# Patient Record
Sex: Female | Born: 1950 | Race: White | Hispanic: No | Marital: Married | Smoking: Current every day smoker
Health system: Southern US, Community
[De-identification: ages and names within clinical notes are randomized; demographics above are authoritative.]

## PROBLEM LIST (undated history)

## (undated) DIAGNOSIS — E7800 Pure hypercholesterolemia, unspecified: Secondary | ICD-10-CM

## (undated) DIAGNOSIS — E538 Deficiency of other specified B group vitamins: Secondary | ICD-10-CM

## (undated) DIAGNOSIS — K219 Gastro-esophageal reflux disease without esophagitis: Secondary | ICD-10-CM

## (undated) DIAGNOSIS — K2270 Barrett's esophagus without dysplasia: Secondary | ICD-10-CM

## (undated) DIAGNOSIS — J449 Chronic obstructive pulmonary disease, unspecified: Secondary | ICD-10-CM

## (undated) HISTORY — PX: CATARACT EXTRACTION: SUR2

---

## 2013-10-09 ENCOUNTER — Encounter (HOSPITAL_BASED_OUTPATIENT_CLINIC_OR_DEPARTMENT_OTHER): Payer: Self-pay | Admitting: Emergency Medicine

## 2013-10-09 ENCOUNTER — Emergency Department (HOSPITAL_BASED_OUTPATIENT_CLINIC_OR_DEPARTMENT_OTHER): Payer: Medicare Other

## 2013-10-09 ENCOUNTER — Emergency Department (HOSPITAL_BASED_OUTPATIENT_CLINIC_OR_DEPARTMENT_OTHER)
Admission: EM | Admit: 2013-10-09 | Discharge: 2013-10-09 | Disposition: A | Discharge status: Home / Self Care | Payer: Medicare Other | Attending: Emergency Medicine | Admitting: Emergency Medicine

## 2013-10-09 DIAGNOSIS — J449 Chronic obstructive pulmonary disease, unspecified: Secondary | ICD-10-CM | POA: Insufficient documentation

## 2013-10-09 DIAGNOSIS — J4489 Other specified chronic obstructive pulmonary disease: Secondary | ICD-10-CM | POA: Insufficient documentation

## 2013-10-09 DIAGNOSIS — S93409A Sprain of unspecified ligament of unspecified ankle, initial encounter: Secondary | ICD-10-CM | POA: Insufficient documentation

## 2013-10-09 DIAGNOSIS — Z862 Personal history of diseases of the blood and blood-forming organs and certain disorders involving the immune mechanism: Secondary | ICD-10-CM | POA: Insufficient documentation

## 2013-10-09 DIAGNOSIS — Z880 Allergy status to penicillin: Secondary | ICD-10-CM | POA: Insufficient documentation

## 2013-10-09 DIAGNOSIS — F17200 Nicotine dependence, unspecified, uncomplicated: Secondary | ICD-10-CM | POA: Insufficient documentation

## 2013-10-09 DIAGNOSIS — X500XXA Overexertion from strenuous movement or load, initial encounter: Secondary | ICD-10-CM | POA: Insufficient documentation

## 2013-10-09 DIAGNOSIS — W108XXA Fall (on) (from) other stairs and steps, initial encounter: Secondary | ICD-10-CM | POA: Insufficient documentation

## 2013-10-09 DIAGNOSIS — Z8639 Personal history of other endocrine, nutritional and metabolic disease: Secondary | ICD-10-CM | POA: Insufficient documentation

## 2013-10-09 DIAGNOSIS — Y939 Activity, unspecified: Secondary | ICD-10-CM | POA: Insufficient documentation

## 2013-10-09 DIAGNOSIS — S93401A Sprain of unspecified ligament of right ankle, initial encounter: Secondary | ICD-10-CM

## 2013-10-09 DIAGNOSIS — Z8719 Personal history of other diseases of the digestive system: Secondary | ICD-10-CM | POA: Insufficient documentation

## 2013-10-09 DIAGNOSIS — Y929 Unspecified place or not applicable: Secondary | ICD-10-CM | POA: Insufficient documentation

## 2013-10-09 HISTORY — DX: Deficiency of other specified B group vitamins: E53.8

## 2013-10-09 HISTORY — DX: Chronic obstructive pulmonary disease, unspecified: J44.9

## 2013-10-09 HISTORY — DX: Gastro-esophageal reflux disease without esophagitis: K21.9

## 2013-10-09 HISTORY — DX: Pure hypercholesterolemia, unspecified: E78.00

## 2013-10-09 HISTORY — DX: Barrett's esophagus without dysplasia: K22.70

## 2013-10-09 MED ORDER — IBUPROFEN 400 MG PO TABS
400.0000 mg | ORAL_TABLET | Freq: Once | ORAL | Status: AC
  Administered 2013-10-09: 400 mg via ORAL
  Filled 2013-10-09: qty 1

## 2013-10-09 MED ORDER — OXYCODONE-ACETAMINOPHEN 5-325 MG PO TABS
1.0000 | ORAL_TABLET | Freq: Once | ORAL | Status: AC
  Administered 2013-10-09: 1 via ORAL
  Filled 2013-10-09: qty 1

## 2013-10-09 MED ORDER — OXYCODONE-ACETAMINOPHEN 5-325 MG PO TABS: 1.0000 | ORAL_TABLET | ORAL | Status: AC | PRN

## 2013-10-09 NOTE — ED Provider Notes (Signed)
CSN: [id number]     Arrival date & time 10/09/13  1649 History  This chart was scribed for Junius ArgyleForrest S Krystyl Cannell, MD by Elveria Risingimelie Horne, ED scribe.  This patient was seen in room MH07/MH07 and the patient's care was started at 4:55 PM.   Chief Complaint  Patient presents with  . Ankle Pain    Patient is a 63 y.o. female presenting with ankle pain. The history is provided by the patient. No language interpreter was used.  Ankle Pain Location:  Ankle and foot Time since incident:  30 minutes Injury: yes   Mechanism of injury: fall   Fall:    Fall occurred:  Down stairs   Height of fall:  1 step   Impact surface:  [employer]   Point of impact:  Hands Ankle location:  R ankle Foot location:  Dorsum of R foot Pain details:    Quality:  Sharp   Radiates to:  Does not radiate   Severity:  Severe   Onset quality:  Sudden Prior injury to area:  No Relieved by:  Nothing Ineffective treatments:  None tried Associated symptoms: no back pain, no fatigue, no fever and no neck pain    HPI Comments: Sherry Simmons is a 63 y.o. female who presents to the Emergency Department complaining of right ankle injury which occurred about 30 minutes ago. Patient fell down stairs, face forward and twisted her right foot. She fell when she reached the last step. Patient fell on carpeted surface. Patient landed on her hands. She did not hit her head or lose consciousness. Patient rates pain 12/10. Patient did not attempt any treatment prior to arrival.   Past Medical History  Diagnosis Date  . COPD (chronic obstructive pulmonary disease)   . Barrett esophagus   . High cholesterol   . GERD (gastroesophageal reflux disease)   . B12 deficiency    Past Surgical History  Procedure Laterality Date  . Cataract extraction     No family history on file. History  Substance Use Topics  . Smoking status: Current Every Day Smoker  . Smokeless tobacco: Not on file  . Alcohol Use: No   OB History   Grav Para Term  Preterm Abortions TAB SAB Ect Mult Living                 Review of Systems  Constitutional: Negative for fever, chills, diaphoresis, activity change, appetite change and fatigue.  HENT: Negative for congestion, facial swelling, rhinorrhea and sore throat.   Eyes: Negative for photophobia and discharge.  Respiratory: Negative for cough, chest tightness and shortness of breath.   Cardiovascular: Negative for chest pain, palpitations and leg swelling.  Gastrointestinal: Negative for nausea, vomiting, abdominal pain and diarrhea.  Endocrine: Negative for polydipsia and polyuria.  Genitourinary: Negative for dysuria, frequency, difficulty urinating and pelvic pain.  Musculoskeletal: Negative for arthralgias, back pain, neck pain and neck stiffness.  Skin: Negative for color change and wound.  Allergic/Immunologic: Negative for immunocompromised state.  Neurological: Negative for facial asymmetry, weakness, numbness and headaches.  Hematological: Does not bruise/bleed easily.  Psychiatric/Behavioral: Negative for confusion and agitation.    Allergies  Penicillins  Home Medications  No current outpatient prescriptions on file.  Triage Vitals: BP 106/68  Pulse 85  Temp(Src) 99.2 F (37.3 C) (Oral)  Resp 18  Ht 5\' 9"  (1.753 m)  Wt 125 lb (56.7 kg)  BMI 18.45 kg/m2  SpO2 96% Physical Exam  Nursing note and vitals reviewed. Constitutional: She is  oriented to person, place, and time. She appears well-developed and well-nourished. No distress.  HENT:  Head: Normocephalic and atraumatic.  Mouth/Throat: Oropharynx is clear and moist.  Eyes: Pupils are equal, round, and reactive to light.  Neck: Normal range of motion. Neck supple.  No vertebral tenderness.   Cardiovascular: Normal rate, regular rhythm and normal heart sounds.   Pulmonary/Chest: Effort normal and breath sounds normal. No respiratory distress. She has no wheezes.  Abdominal: Soft. She exhibits no distension. There is  no tenderness. There is no rebound and no guarding.  Musculoskeletal: She exhibits no edema and no tenderness.  Mild swelling and ecchymosis to right lateral malleolus. Mild to moderate tenderness to palpation in this area.   2+ DP and PT pulses in bilateral lower extremities.    Normal capillary refill in right lower extremity.  Motor skills grossly intact in the right lower extremity.  Neurological: She is alert and oriented to person, place, and time.  Skin: Skin is warm and dry.  Psychiatric: She has a normal mood and affect. Her behavior is normal.    ED Course  Procedures (including critical care time) DIAGNOSTIC STUDIES: Oxygen Saturation is 96% on room air, normal by my interpretation.    COORDINATION OF CARE: 5:01 PM- Will order Percocet. Pt advised of plan for treatment and pt agrees.   Labs Review Labs Reviewed - No data to display Imaging Review Dg Ankle Complete Right  10/09/2013   CLINICAL DATA:  Trauma and pain.  EXAM: RIGHT ANKLE - COMPLETE 3+ VIEW  COMPARISON:  None.  FINDINGS: No definite acute fracture or dislocation. Mild tibiotalar osteoarthritis. Osseous irregularity about the navicular is favored to be degenerative.  IMPRESSION: Probable degenerative irregularity about the navicula anteriorly. Correlate with point tenderness.  Otherwise, no acute osseous abnormality.   Electronically Signed   By: Jeronimo Greaves M.D.   On: 10/09/2013 17:40   Dg Foot Complete Right  10/09/2013   CLINICAL DATA:  Fall, right ankle and foot pain.  EXAM: RIGHT FOOT COMPLETE - 3+ VIEW  COMPARISON:  DG ANKLE COMPLETE*R* dated 10/09/2013  FINDINGS: Soft tissue swelling over the right fifth metatarsal. Irregularity noted along the anterior aspect of the navicular felt to most likely be degenerative. Recommend correlation for pain in this area. No acute fracture, subluxation or dislocation visualized.  IMPRESSION: No definite acute bony abnormality. Irregularity along the anterior aspect of the  navicular likely degenerative.   Electronically Signed   By: Charlett Nose M.D.   On: 10/09/2013 17:40    EKG Interpretation   None       MDM   1. Right ankle sprain    5:24 PM 63 y.o. female who presents with mechanical fall down one stair. She braced her fall denies hitting her head or loss of consciousness. Vital signs are unremarkable here. Mild deformity to right ankle. Will get pain control with Percocet and imaging.   6:14 PM: Irregularity noted at ant aspect of navicular on plain film, likely degenerative. I re-examined the pt, no real focal ttp in this area and pt's injury is localized to the right lateral ankle. Will ace wrap, place in post op shoe and provide crutches.  I have discussed the diagnosis/risks/treatment options with the patient and believe the pt to be eligible for discharge home to follow-up with pcp in 1 week if pain persists. We also discussed returning to the ED immediately if new or worsening sx occur. We discussed the sx which are most concerning (e.g.,  worsening pain) that necessitate immediate return. Medications administered to the patient during their visit and any new prescriptions provided to the patient are listed below.  Medications given during this visit Medications  ibuprofen (ADVIL,MOTRIN) tablet 400 mg (not administered)  oxyCODONE-acetaminophen (PERCOCET/ROXICET) 5-325 MG per tablet 1 tablet (1 tablet Oral Given 10/09/13 1705)    New Prescriptions   OXYCODONE-ACETAMINOPHEN (PERCOCET) 5-325 MG PER TABLET    Take 1 tablet by mouth every 4 (four) hours as needed.      I personally performed the services described in this documentation, which was scribed in my presence. The recorded information has been reviewed and is accurate.     Junius Argyle, MD 10/09/13 1816

## 2013-10-09 NOTE — Discharge Instructions (Signed)

## 2013-10-09 NOTE — ED Notes (Signed)
Fell down stairs, appx 1-2 steps, twisting right ankle.

## 2014-07-18 IMAGING — CR DG ANKLE COMPLETE 3+V*R*
3 series · 3 of 3 positions shown · non-contrast
Comparison: None.

CLINICAL DATA: Trauma and pain.

EXAM:
RIGHT ANKLE - COMPLETE 3+ VIEW

[t ankle joint ap right]
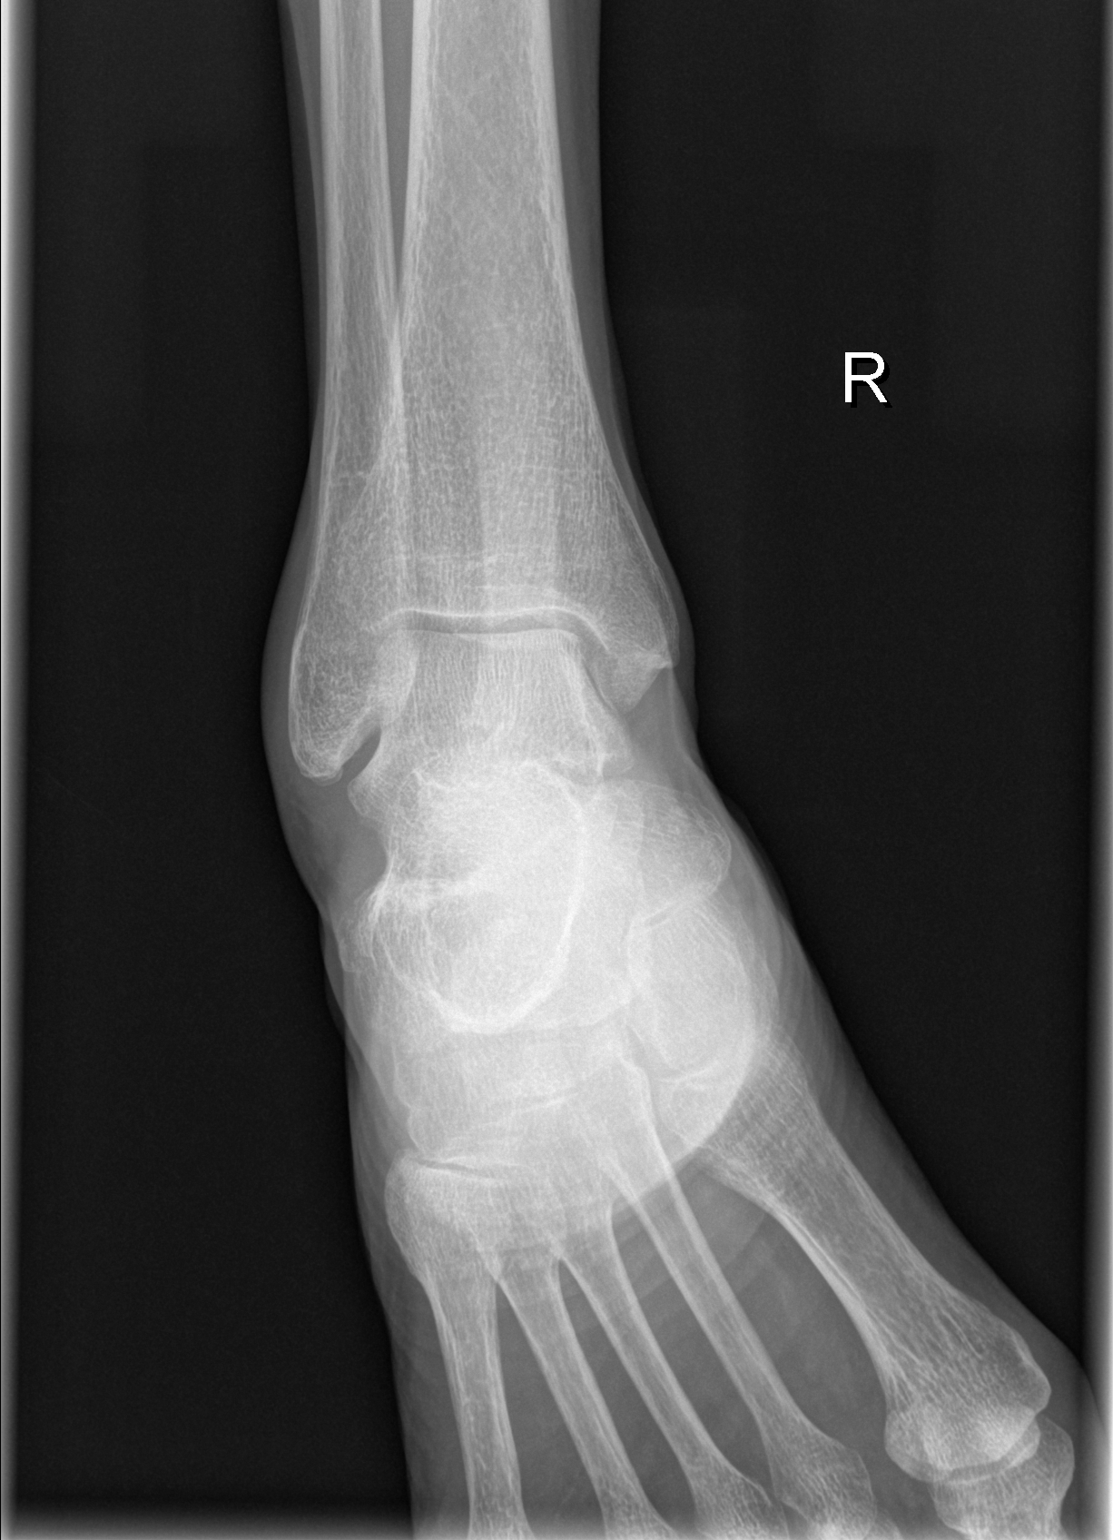

[t ankle joint oblique right]
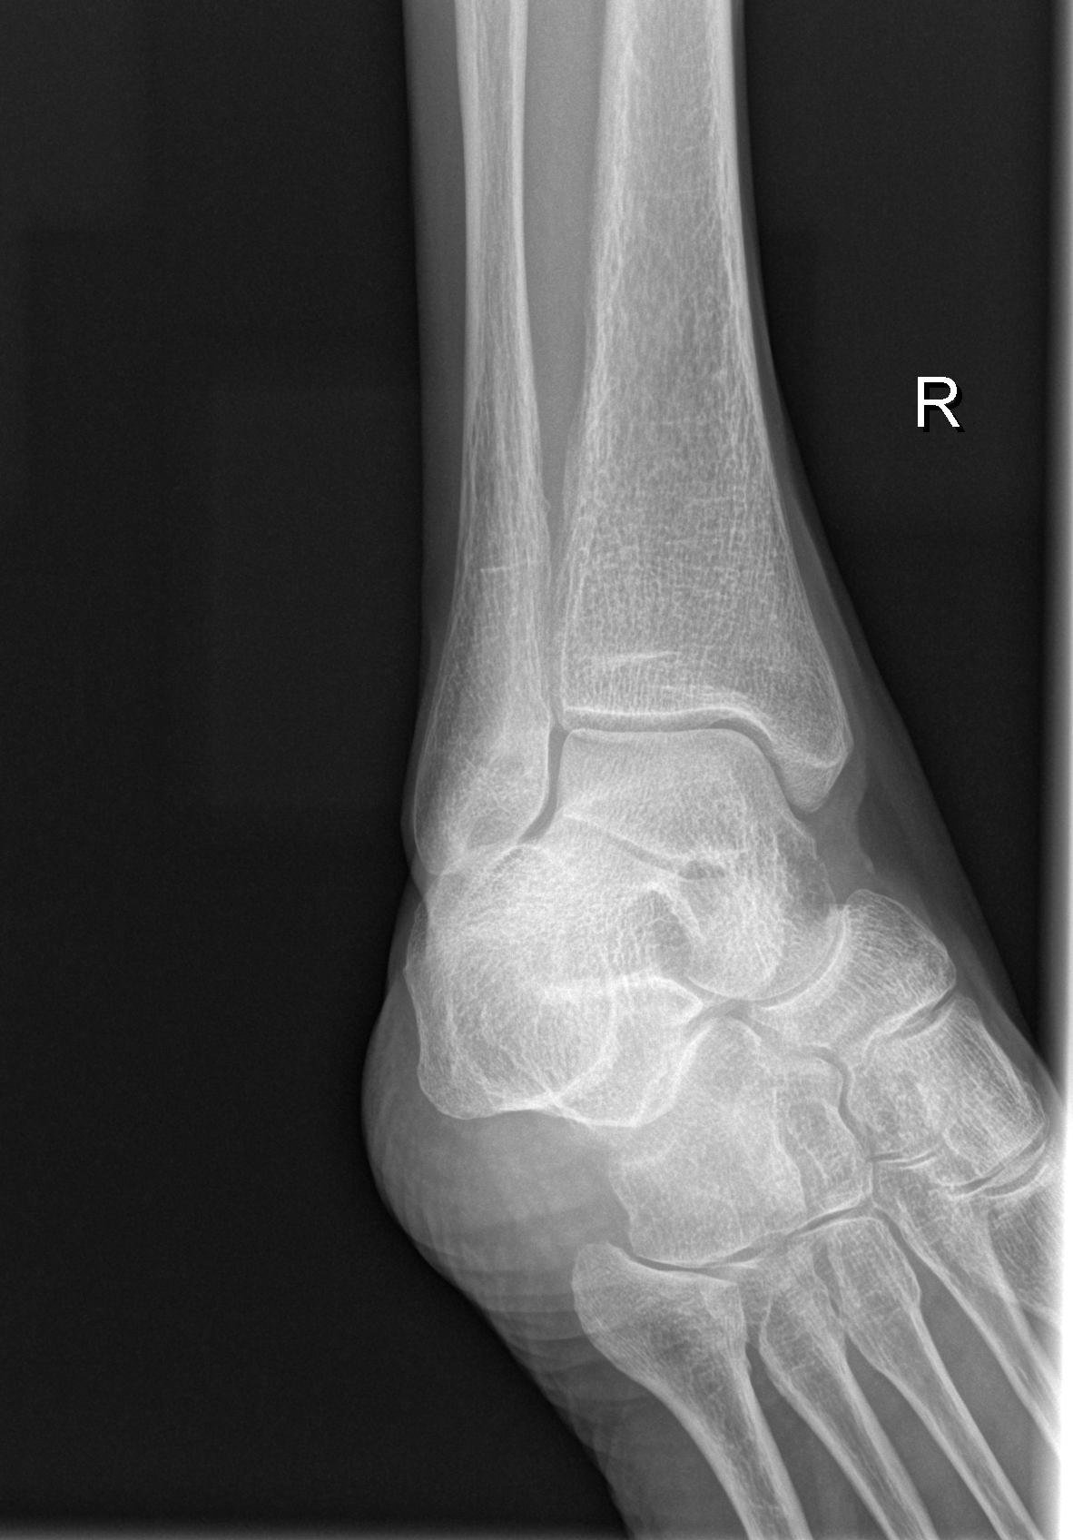

[t ankle joint lat right]
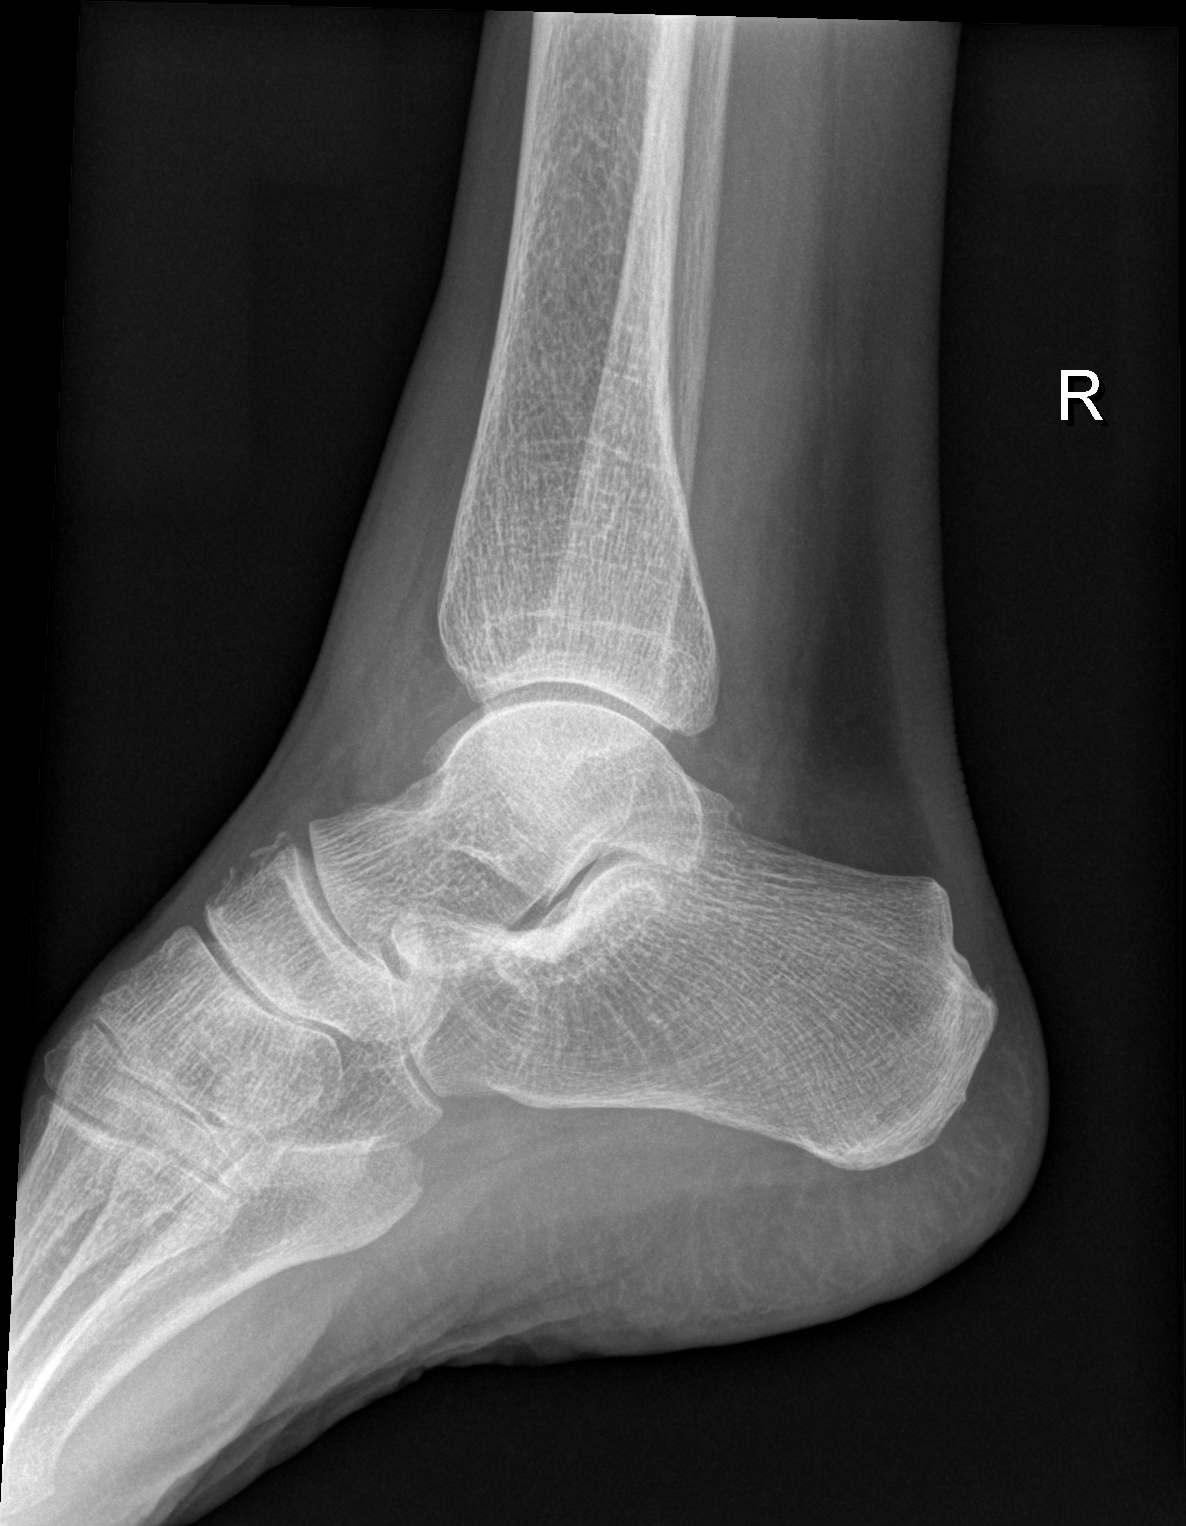

[3 of 3 positions shown; findings below may reference images not displayed]

FINDINGS: No definite acute fracture or dislocation. Mild tibiotalar
osteoarthritis. Osseous irregularity about the navicular is favored
to be degenerative.
IMPRESSION: Probable degenerative irregularity about the navicula anteriorly.
Correlate with point tenderness.

Otherwise, no acute osseous abnormality.
